# Patient Record
Sex: Female | Born: 1975 | Race: White | Hispanic: No | Marital: Married
Health system: Southern US, Community
[De-identification: ages and names within clinical notes are randomized; demographics above are authoritative.]

---

## 2020-06-03 ENCOUNTER — Emergency Department (HOSPITAL_COMMUNITY)
Admission: EM | Admit: 2020-06-03 | Discharge: 2020-06-03 | Disposition: A | Discharge status: Home / Self Care | Payer: BC Managed Care – PPO | Attending: Emergency Medicine | Admitting: Emergency Medicine

## 2020-06-03 ENCOUNTER — Other Ambulatory Visit: Payer: Self-pay

## 2020-06-03 ENCOUNTER — Encounter (HOSPITAL_COMMUNITY): Payer: Self-pay

## 2020-06-03 DIAGNOSIS — T7840XA Allergy, unspecified, initial encounter: Secondary | ICD-10-CM | POA: Diagnosis present

## 2020-06-03 DIAGNOSIS — Z5321 Procedure and treatment not carried out due to patient leaving prior to being seen by health care provider: Secondary | ICD-10-CM | POA: Diagnosis not present

## 2020-06-03 NOTE — ED Notes (Signed)
Pt caled x3. No answer

## 2020-06-03 NOTE — ED Triage Notes (Signed)
Pt arrives POV for eval of poss allergic rxn to food she ate around 1730. Developed lip tingling shortly after, no swelling. 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 Denies SOB, lip tingling, wheezing. LS CTAB.

## 2020-07-30 ENCOUNTER — Other Ambulatory Visit (HOSPITAL_COMMUNITY)
Admission: RE | Admit: 2020-07-30 | Discharge: 2020-07-30 | Disposition: A | Discharge status: Home / Self Care | Payer: BC Managed Care – PPO | Source: Ambulatory Visit | Attending: Family Medicine | Admitting: Family Medicine

## 2020-07-30 DIAGNOSIS — Z124 Encounter for screening for malignant neoplasm of cervix: Secondary | ICD-10-CM | POA: Diagnosis present

## 2020-07-31 ENCOUNTER — Other Ambulatory Visit: Payer: Self-pay | Admitting: Family Medicine

## 2020-07-31 DIAGNOSIS — Z1231 Encounter for screening mammogram for malignant neoplasm of breast: Secondary | ICD-10-CM

## 2020-08-02 LAB — CYTOLOGY - PAP
Comment: NEGATIVE
Diagnosis: UNDETERMINED — AB
High risk HPV: NEGATIVE

## 2020-08-12 ENCOUNTER — Other Ambulatory Visit: Payer: Self-pay

## 2020-08-12 ENCOUNTER — Ambulatory Visit
Admission: RE | Admit: 2020-08-12 | Discharge: 2020-08-12 | Disposition: A | Discharge status: Home / Self Care | Payer: BC Managed Care – PPO | Source: Ambulatory Visit | Attending: Family Medicine | Admitting: Family Medicine

## 2020-08-12 DIAGNOSIS — Z1231 Encounter for screening mammogram for malignant neoplasm of breast: Secondary | ICD-10-CM

## 2021-07-31 IMAGING — MG DIGITAL SCREENING BILAT W/ TOMO W/ CAD
8 series · 9 of 24 positions shown · non-contrast
Comparison: None.

CLINICAL DATA: Screening.

EXAM:
DIGITAL SCREENING BILATERAL MAMMOGRAM WITH TOMO AND CAD

[R CC synth-2D]
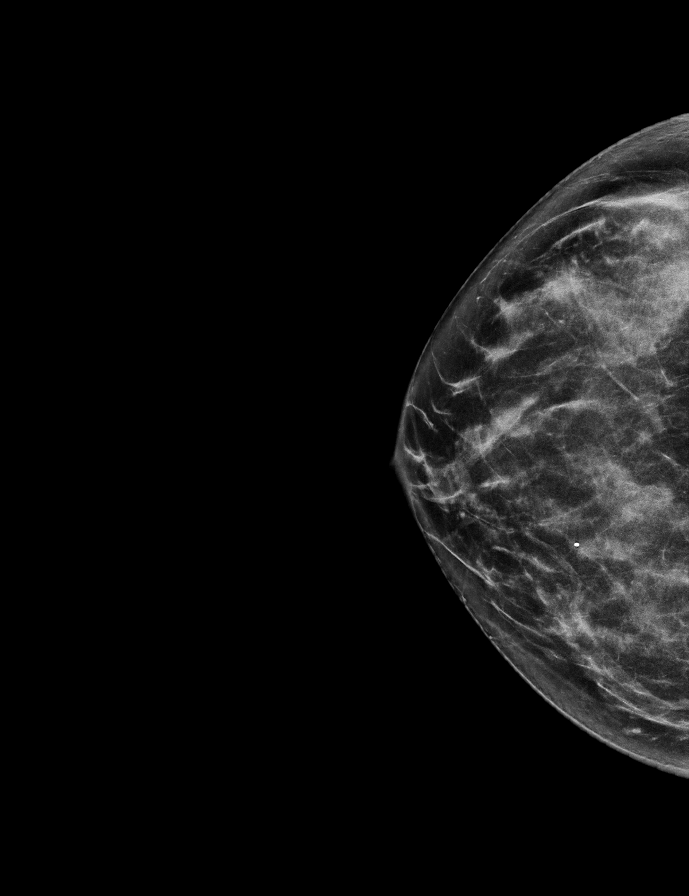

[R MLO synth-2D]
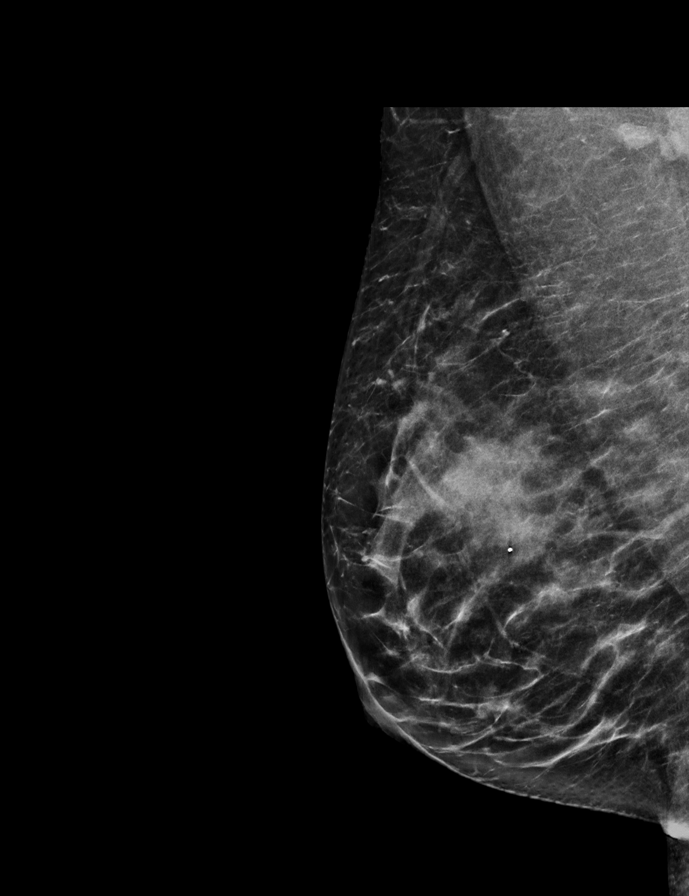

[L MLO synth-2D]
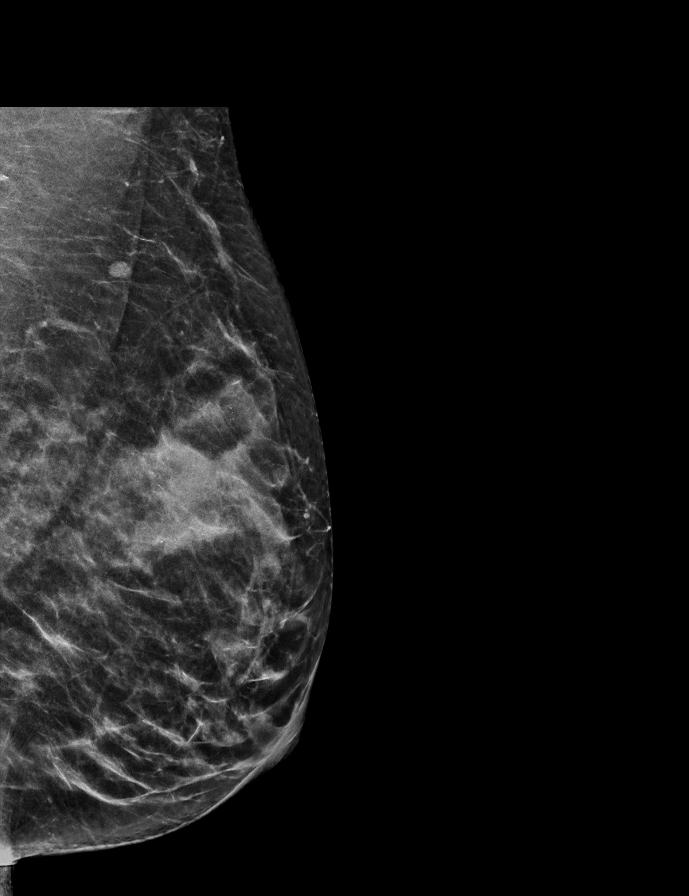

[L CC synth-2D]
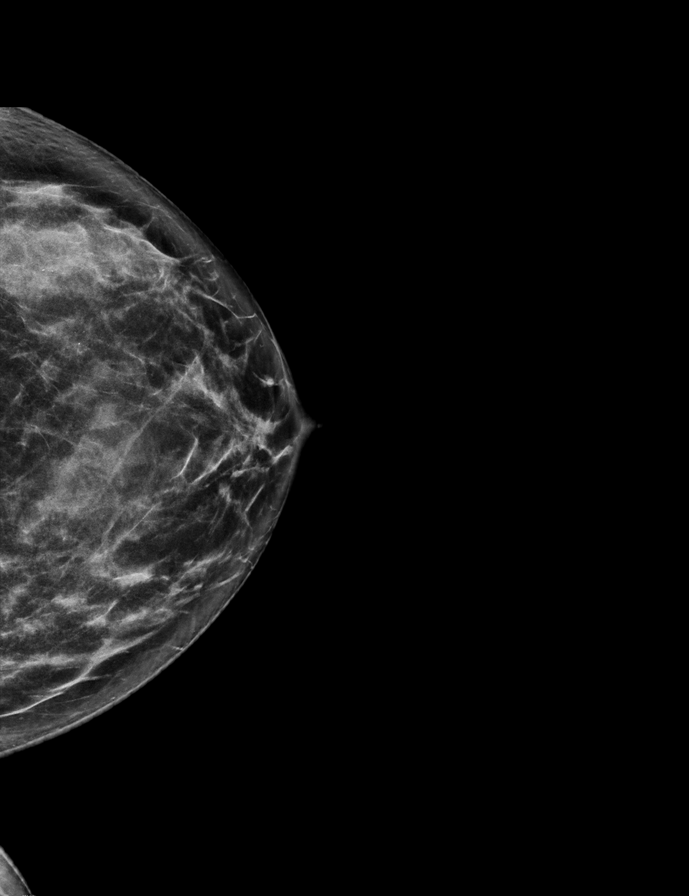

[L CC tomo · 2 of 57 frames shown]
[frame 19/57]
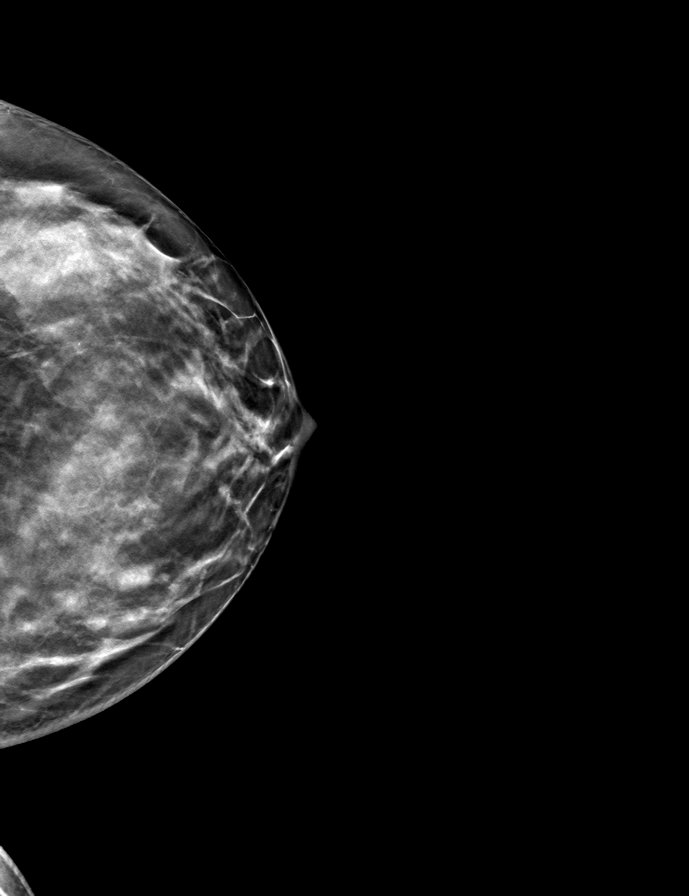
[frame 29/57]
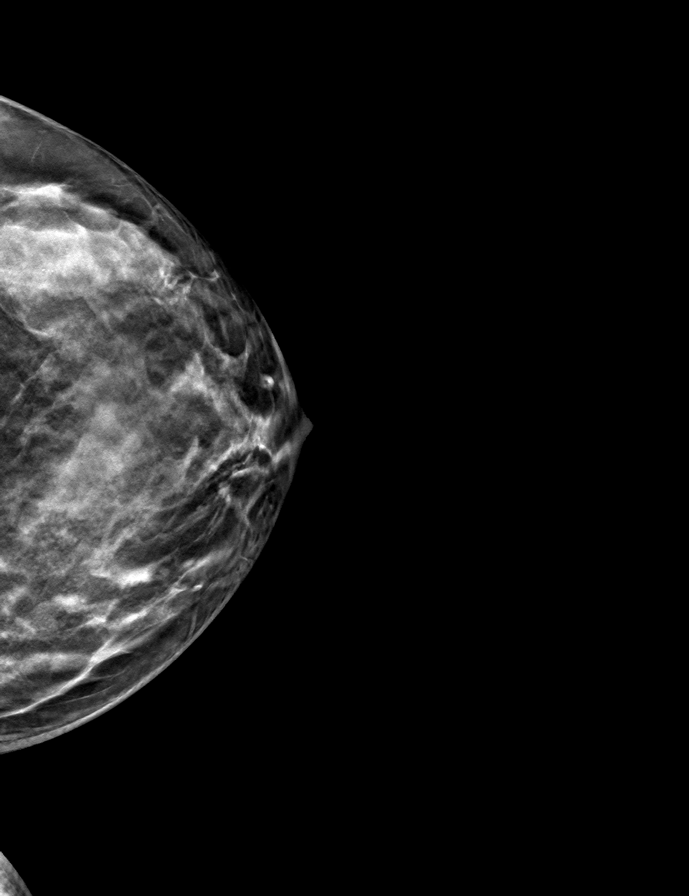

[R MLO tomo · tomo slice 31/62.0]
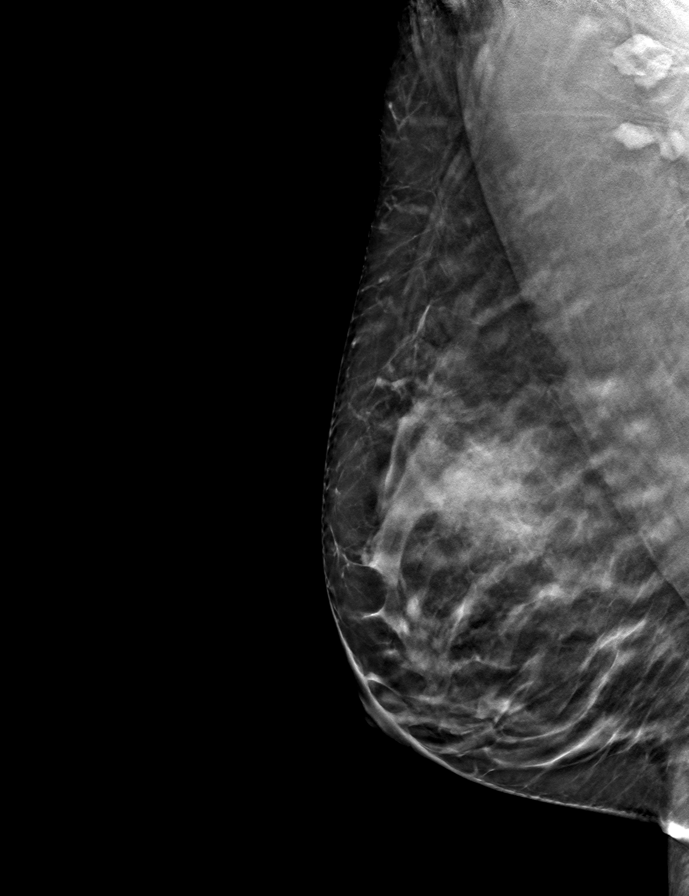

[L MLO tomo · tomo slice 29/56.0]
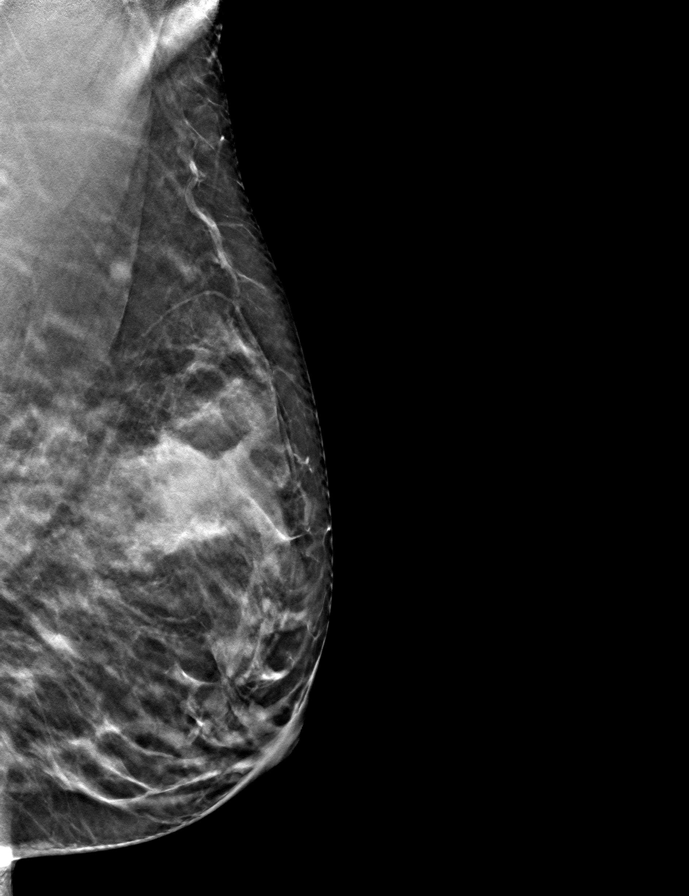

[R CC tomo · tomo slice 28/55.0]
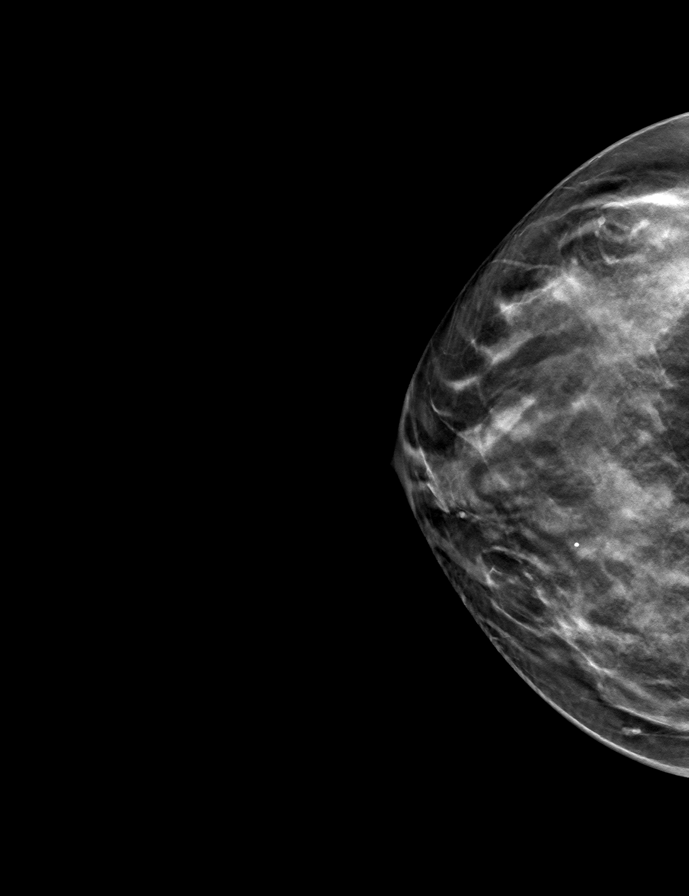

[9 of 24 positions shown; findings below may reference images not displayed]

ACR Breast Density Category c: The breast tissue is heterogeneously
dense, which may obscure small masses
FINDINGS: There are no findings suspicious for malignancy. Images were
processed with CAD.
IMPRESSION: No mammographic evidence of malignancy. A result letter of this
screening mammogram will be mailed directly to the patient.

RECOMMENDATION:
Screening mammogram in one year. (Code:EM-2-IHY)

BI-RADS CATEGORY  1: Negative.

## 2022-10-28 ENCOUNTER — Other Ambulatory Visit: Payer: Self-pay | Admitting: Family Medicine

## 2022-10-28 DIAGNOSIS — Z1231 Encounter for screening mammogram for malignant neoplasm of breast: Secondary | ICD-10-CM

## 2023-01-07 ENCOUNTER — Ambulatory Visit: Payer: BC Managed Care – PPO

## 2023-03-22 ENCOUNTER — Ambulatory Visit
Admission: RE | Admit: 2023-03-22 | Discharge: 2023-03-22 | Disposition: A | Discharge status: Home / Self Care | Payer: BC Managed Care – PPO | Source: Ambulatory Visit | Attending: Family Medicine | Admitting: Family Medicine

## 2023-03-22 DIAGNOSIS — Z1231 Encounter for screening mammogram for malignant neoplasm of breast: Secondary | ICD-10-CM

## 2024-04-24 ENCOUNTER — Other Ambulatory Visit: Payer: Self-pay | Admitting: Family Medicine

## 2024-04-24 DIAGNOSIS — Z1231 Encounter for screening mammogram for malignant neoplasm of breast: Secondary | ICD-10-CM

## 2024-04-26 ENCOUNTER — Ambulatory Visit
Admission: RE | Admit: 2024-04-26 | Discharge: 2024-04-26 | Disposition: A | Discharge status: Home / Self Care | Payer: Self-pay | Source: Ambulatory Visit | Attending: Family Medicine | Admitting: Family Medicine

## 2024-04-26 DIAGNOSIS — Z1231 Encounter for screening mammogram for malignant neoplasm of breast: Secondary | ICD-10-CM
